# Patient Record
Sex: Female | Born: 1970 | Race: White | Hispanic: No | Marital: Married | Smoking: Never smoker
Health system: Southern US, Community
[De-identification: ages and names within clinical notes are randomized; demographics above are authoritative.]

## PROBLEM LIST (undated history)

## (undated) DIAGNOSIS — R519 Headache, unspecified: Secondary | ICD-10-CM

## (undated) DIAGNOSIS — R51 Headache: Secondary | ICD-10-CM

## (undated) DIAGNOSIS — E785 Hyperlipidemia, unspecified: Secondary | ICD-10-CM

## (undated) DIAGNOSIS — I10 Essential (primary) hypertension: Secondary | ICD-10-CM

## (undated) HISTORY — DX: Headache: R51

## (undated) HISTORY — DX: Headache, unspecified: R51.9

## (undated) HISTORY — DX: Hyperlipidemia, unspecified: E78.5

## (undated) HISTORY — DX: Essential (primary) hypertension: I10

---

## 2007-08-13 ENCOUNTER — Ambulatory Visit: Payer: Self-pay | Admitting: Emergency Medicine

## 2008-05-24 ENCOUNTER — Ambulatory Visit: Payer: Self-pay | Admitting: Internal Medicine

## 2010-06-12 ENCOUNTER — Ambulatory Visit: Payer: Self-pay | Admitting: Family Medicine

## 2012-08-06 LAB — CBC
HCT: 30.8 % — ABNORMAL LOW (ref 35.0–47.0)
HGB: 10.1 g/dL — ABNORMAL LOW (ref 12.0–16.0)
MCH: 29 pg (ref 26.0–34.0)
MCV: 89 fL (ref 80–100)
Platelet: 269 10*3/uL (ref 150–440)
RBC: 3.48 10*6/uL — ABNORMAL LOW (ref 3.80–5.20)
RDW: 13.1 % (ref 11.5–14.5)
WBC: 10.6 10*3/uL (ref 3.6–11.0)

## 2012-08-06 LAB — COMPREHENSIVE METABOLIC PANEL
Alkaline Phosphatase: 92 U/L (ref 50–136)
BUN: 17 mg/dL (ref 7–18)
Bilirubin,Total: 0.2 mg/dL (ref 0.2–1.0)
Calcium, Total: 9 mg/dL (ref 8.5–10.1)
Chloride: 104 mmol/L (ref 98–107)
Co2: 28 mmol/L (ref 21–32)
Creatinine: 1.02 mg/dL (ref 0.60–1.30)
EGFR (African American): >60
EGFR (Non-African Amer.): >60
Glucose: 97 mg/dL (ref 65–99)
Osmolality: 285 (ref 275–301)
Potassium: 3.6 mmol/L (ref 3.5–5.1)
SGPT (ALT): 25 U/L (ref 12–78)
Sodium: 142 mmol/L (ref 136–145)
Total Protein: 7.4 g/dL (ref 6.4–8.2)

## 2012-08-06 LAB — CK TOTAL AND CKMB (NOT AT ARMC): CK-MB: 3.1 ng/mL (ref 0.5–3.6)

## 2012-08-06 LAB — URINALYSIS, COMPLETE
Bilirubin,UR: NEGATIVE
Glucose,UR: NEGATIVE mg/dL (ref 0–75)
Nitrite: NEGATIVE
RBC,UR: 2 /HPF (ref 0–5)
Squamous Epithelial: 15
WBC UR: 6 /HPF (ref 0–5)

## 2012-08-06 LAB — TROPONIN I: Troponin-I: <0.02 ng/mL

## 2012-08-06 LAB — PHOSPHORUS: Phosphorus: 2.8 mg/dL (ref 2.5–4.9)

## 2012-08-06 LAB — MAGNESIUM: Magnesium: 1.8 mg/dL

## 2012-08-07 ENCOUNTER — Observation Stay: Payer: Self-pay | Admitting: Specialist

## 2012-08-07 LAB — TROPONIN I: Troponin-I: <0.02 ng/mL

## 2013-08-18 IMAGING — CR DG CHEST 1V PORT
1 series · 1 of 1 positions shown · non-contrast
Comparison: none

REASON FOR EXAM: syncope
COMMENTS:

[ap]
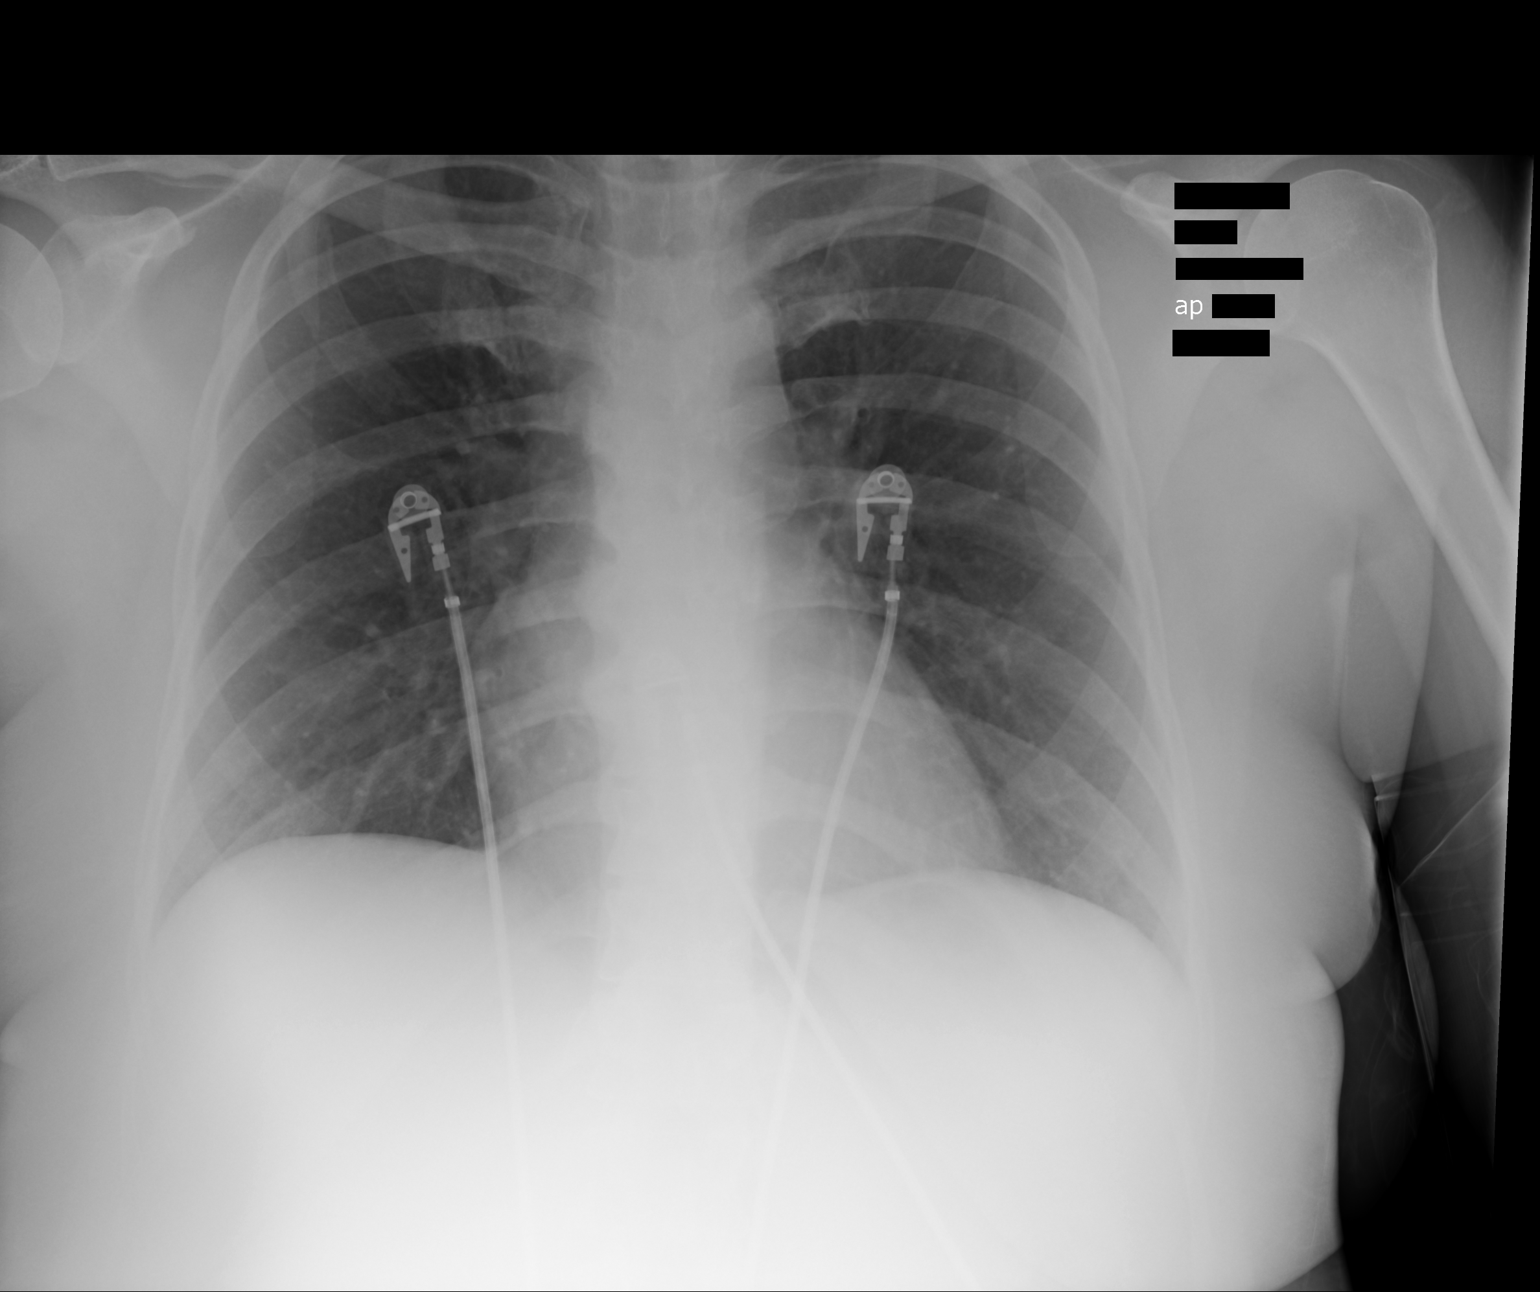

[1 of 1 positions shown; findings below may reference images not displayed]

PROCEDURE:     DXR - DXR PORTABLE CHEST SINGLE VIEW  - August 06, 2012  [DATE]

RESULT:     A cardiac monitoring electrodes are present. The lungs are
clear. The heart and pulmonary vessels are normal. The bony and mediastinal
structures are unremarkable. There is no effusion. There is no pneumothorax
or evidence of congestive failure.
IMPRESSION: No acute cardiopulmonary disease.

[REDACTED]

## 2015-02-07 NOTE — H&P (Signed)
PATIENT NAME:  Beth Jones, Beth Jones MR#:  [id number] DATE OF BIRTH:  Apr 13, 1971  DATE OF ADMISSION:  08/07/2012  PRIMARY CARE PHYSICIAN: [facility]  REFERRING PHYSICIAN: Dr. Theophilus Kinds  CHIEF COMPLAINT: Syncope.   HISTORY OF PRESENT ILLNESS: Ms. Fedorko is 44 year old pleasant Caucasian female with history of type 1 diabetes mellitus on insulin, systemic hypertension and hypercholesterolemia. She was in her usual state of health until today while she was at work. She lifted a heavy box and shortly she felt tingling or numbness sensation in both arms. She tried to relax but then when she tried to walk she developed dizziness and then suddenly she fell to the floor and having syncope, but without true loss of consciousness as patient she still can hear her friends talking. EMS was called and patient was transported to the Emergency Department for further evaluation. Patient also indicated that she felt ringing in the ear. Her blood pressure here was measured 78/43. Patient received intravenous fluids which helped to improve her blood pressure. Patient denies having any palpitations. No chest pain. No shortness of breath. No cough. No hemoptysis. No calf tenderness or swelling. It is worth to mention that her primary care physician increased her blood pressure medication a month ago and she said that the dose was 10 mg and it was increased to 20 mg. She does not recall the name of the medication but it appears to be in combination with hydrochlorothiazide. Patient indicates that since that time on and off she may feel a little dizziness but not like today. Patient was admitted for observation and to also monitor on telemetry to ensure stability. Her EKG and blood work-up were unremarkable.    REVIEW OF SYSTEMS: CONSTITUTIONAL: Patient denies having any fever. No chills. No fatigue. EYES: No blurring of vision. No double vision. ENT: No hearing impairment other than her chronic decreased hearing but no  change lately. No sore throat. No dysphagia. CARDIOVASCULAR: No chest pain. No shortness of breath. No edema. RESPIRATORY: No shortness of breath. No cough. No chest pain. No hemoptysis. GASTROINTESTINAL: No abdominal pain. No vomiting. No diarrhea. GENITOURINARY: No dysuria. No frequency of urination. MUSCULOSKELETAL: No joint pain or swelling. No muscular pain or swelling. INTEGUMENTARY: No skin rash. No ulcers. NEUROLOGY: No focal weakness. No seizure activity. No headache. PSYCHIATRY: No anxiety. No depression. ENDOCRINE: No polyuria or polydipsia. No heat or cold intolerance.   PAST MEDICAL HISTORY:  1. Type 1 diabetes mellitus on insulin since age of 14.  2. Systemic hypertension.   3. Hypercholesterolemia.  4. Obesity.   FAMILY HISTORY: Her father suffered from diabetes mellitus and hypertension. Her mother suffered from hypertension. She has a brother who has diabetes mellitus.   SOCIAL HABITS: Nonsmoker. No history of alcohol or drug abuse.   SOCIAL HISTORY: She is married, living with her husband. She works at [employer]. Her job is to either work as a Paramedic.    ADMISSION MEDICATIONS:  1. Simvastatin 40 mg a day.  2. Lantus insulin using 89 units at night.  3. Humalog using according to her diet but average 25 to 30 units before each meal and also she uses sliding scale in case it is needed.  4. She is on a blood pressure medication that she does not recall the name, its dose was doubled a month ago.  5. She is on gabapentin 3 times a day but does not recall the dose.   ALLERGIES: No known drug allergies.  PHYSICAL EXAMINATION:  VITAL SIGNS: Blood pressure 113/56, respiratory rate 18, pulse 97, temperature 98.5, oxygen saturation 99%. This blood pressure had improved after IV fluids. The initial blood pressure was 78/43.   GENERAL APPEARANCE: Young female lying in bed in no acute distress.   HEAD AND NECK: No pallor. No icterus. No cyanosis.    ENT: Hearing was slightly impaired. Nasal mucosa, lips, tongue were normal.   EYES: Normal eyelids and conjunctiva. Pupils about 4 to 5 mm, equal and reactive to light.   NECK: Supple. Trachea at midline. No thyromegaly. No cervical lymphadenopathy. No masses.   HEART: Normal S1, S2. No S3, S4. No murmur. No gallop. No carotid bruits.   RESPIRATORY: Normal breathing pattern without use of accessory muscles. No rales. No wheezing.   ABDOMEN: Soft without tenderness. No hepatosplenomegaly. No masses. No hernias.   SKIN: No ulcers. No subcutaneous nodules.   MUSCULOSKELETAL: No joint swelling. No clubbing.   NEUROLOGIC: Cranial nerves II through XII are intact. No focal motor deficit.   PSYCHIATRIC: Patient is alert, oriented x3. Mood and affect were normal.   LABORATORY, DIAGNOSTIC, AND RADIOLOGICAL DATA: EKG showed normal sinus rhythm or sinus bradycardia at rate of 59 per minute. Unremarkable EKG. Chest x-ray showed normal heart size. No consolidation, no effusion. Serum glucose 97, BUN 17, creatinine 1.02, sodium 142, potassium 3.6. Urine pregnancy test was negative. Calcium 9, phosphorus 2.8, magnesium was normal at 1.8. Normal liver function tests. Total CPK 95, troponin less than 0.02. TSH 1.8. CBC showed white count 10,000, hemoglobin 10, hematocrit 30, platelet count 269. Normal indices MCV, MCH, MCHC were all normal. Urinalysis showed cloudy urine, however, there are 15 epithelial cells and 6 white blood cells.   ASSESSMENT:  1. Syncope in association with hypotension likely potentiated by recent increase of her blood pressure medication.  2. Diabetes mellitus, type 1.  3. Peripheral neuropathy.  4. Hypercholesterolemia.  5. Systemic hypertension.   PLAN: Patient received so far 2 liters of intravenous fluids. I will maintain IV fluid at a rate of 100 mL/h. I will hold her blood pressure medication. However, upon discharge patient needs to reduce her blood pressure medication  to the original dose a month ago and follow up with her primary care physician. I will follow up on her troponin q.8 hours x2. Will monitor over telemetry. Patient right now is completely asymptomatic after the IV fluids and improvement of her blood pressure. I will continue her simvastatin. Will place her on Accu-Chek and resume her Lantus insulin and Humalog. Patient stated that she wants to go home now but then she compromised to stay overnight.   TIME SPENT EVALUATING THIS PATIENT: More than 55 minutes.   ____________________________ Carney CornersAmir M. Rudene Rearwish, MD amd:cms D: 08/07/2012 00:29:26 ET T: 08/07/2012 09:15:45 ET JOB#: [id number]  cc: Carney CornersAmir M. Rudene Rearwish, MD, <Dictator> [facility] Zollie ScaleAMIR M Daine Gunther MD ELECTRONICALLY SIGNED 08/07/2012 22:31

## 2015-02-07 NOTE — Discharge Summary (Signed)
PATIENT NAME:  Beth Jones, Beth Jones MR#:  [id number] DATE OF BIRTH:  1971/09/11  DATE OF ADMISSION:  08/07/2012 DATE OF DISCHARGE:  08/07/2012  For a detailed note, please take a look at the history and physical done on admission by Dr. Rudene Rearwish.   DIAGNOSES AT DISCHARGE:  1. Presyncope likely secondary to hypotension.  2. Hypotension secondary to being on high dose antihypertensives.  3. Diabetes.  4. Diabetic neuropathy.   DIET: The patient is being discharged on a low sodium, low fat, American Diabetic Association diet.   ACTIVITY: As tolerated.   FOLLOWUP: Follow-up with Dr. Gershon Crane'Connell in the next 1 to 2 weeks.   DISCHARGE MEDICATIONS:  1. Gabapentin 600 mg t.i.d.  2. Regular insulin sliding scale t.i.d. as needed.  3. Lantus 80 units at bedtime.  4. Simvastatin 40 mg at bedtime.  5. HCTZ/lisinopril 12.5/20 half a tab daily.   PERTINENT STUDIES DONE DURING THE HOSPITAL COURSE: A chest x-ray done on admission showing no acute cardiopulmonary disease.   BRIEF HOSPITAL COURSE: This is a 44 year old female with medical problems as mentioned above who presented to the hospital secondary to a presyncopal episode and noted to be hypotensive.  1. Presyncope. The patient apparently had an episode where she fell to the floor but did not lose consciousness. EMS was called. She was noted to be significantly hypotensive with systolic blood pressures in the 80's. The patient's antihypertensives have actually been doubled in the past month and, as per the family, she has also been running low blood pressures at home. Her antihypertensives were held. She was aggressively hydrated with IV fluids. Her hypotension and her clinical symptoms have since then resolved.  2. Hypertension with element of hypotension. Again, the patient was on lisinopril/HCTZ which was recently increased. The dose of her lisinopril/HCTZ has now been cut in half and she has been advised to keep a close eye on her blood pressure and  to hold hypertensives if her systolic blood pressure is less than 90. She will have close follow-up with her primary care physician as outpatient.  3. Diabetes. The patient had no evidence of hypoglycemia. She will continue her Lantus and regular insulin sliding scale as mentioned.  4. Diabetic neuropathy. The patient was maintained on Neurontin and she will resume that upon discharge too.   CODE STATUS: The patient is a FULL CODE.   TIME SPENT: 35 minutes.   ____________________________ Rolly PancakeVivek J. Cherlynn KaiserSainani, MD vjs:drc D: 08/07/2012 15:55:07 ET T: 08/09/2012 14:58:55 ET JOB#: [id number]  cc: Rolly PancakeVivek J. Cherlynn KaiserSainani, MD, <Dictator> Dr. Tanya Nones'Connell  Shamaine Mulkern J Javani Spratt MD ELECTRONICALLY SIGNED 08/09/2012 21:57

## 2018-11-16 ENCOUNTER — Encounter: Payer: BLUE CROSS/BLUE SHIELD | Attending: Family Medicine | Admitting: Dietician

## 2018-11-16 ENCOUNTER — Encounter: Payer: Self-pay | Admitting: Dietician

## 2018-11-16 VITALS — BP 154/76 | Ht 65.0 in | Wt 186.8 lb

## 2018-11-16 DIAGNOSIS — Z6831 Body mass index (BMI) 31.0-31.9, adult: Secondary | ICD-10-CM | POA: Diagnosis not present

## 2018-11-16 DIAGNOSIS — Z794 Long term (current) use of insulin: Secondary | ICD-10-CM | POA: Diagnosis not present

## 2018-11-16 DIAGNOSIS — Z79899 Other long term (current) drug therapy: Secondary | ICD-10-CM | POA: Diagnosis not present

## 2018-11-16 DIAGNOSIS — E669 Obesity, unspecified: Secondary | ICD-10-CM | POA: Insufficient documentation

## 2018-11-16 DIAGNOSIS — Z713 Dietary counseling and surveillance: Secondary | ICD-10-CM | POA: Diagnosis not present

## 2018-11-16 DIAGNOSIS — E10649 Type 1 diabetes mellitus with hypoglycemia without coma: Secondary | ICD-10-CM | POA: Diagnosis not present

## 2018-11-16 DIAGNOSIS — E1040 Type 1 diabetes mellitus with diabetic neuropathy, unspecified: Secondary | ICD-10-CM

## 2018-11-16 NOTE — Patient Instructions (Signed)
  Check blood sugars 4 x day before each meal and before bed every day + some 2 hr after meals  Bring blood sugar records to the next appointment/class   Eat 3 meals day and  1 snack/day at bedtime   Space meals 4-5 hours apart  Eat 2-3 carbohydrate servings/meal + protein  Eat 1 carbohydrate serving/snack + protein  Avoid sugar sweetened drinks (soda, tea, coffee, sports drinks, juices)  Drinks plenty of water   Limit intake of sweets, fried foods and snack foods  Complete 3 Day Food Record and bring to next appt  Make  dentist / eye doctor appointments  Get a Sharps container  Carry fast acting glucose and a snack at all times  Rotate injection sites  Carry medical alert ID at all times  Return for appointment/classes on:  12-01-18

## 2018-11-16 NOTE — Progress Notes (Signed)
Diabetes Self-Management Education  Visit Type: First/Initial  Appt. Start Time: 0900 Appt. End Time:1030  11/16/2018  [name], identified by name and date of birth, is a 48 y.o. female with a diagnosis of Diabetes: Type 1.   ASSESSMENT  Blood pressure (!) 154/76, height 5\' 5"  (1.651 m), weight 186 lb 12.8 oz (84.7 kg). Body mass index is 31.09 kg/m.  Diabetes Self-Management Education - 11/16/18 1303      Visit Information   Visit Type  First/Initial      Initial Visit   Diabetes Type  Type 1      Health Coping   How would you rate your overall health?  Fair      Psychosocial Assessment   Patient Belief/Attitude about Diabetes  Motivated to manage diabetes    Self-care barriers  Hard of hearing   wears 2 hearing aides   Self-management support  Doctor's office;Family    Other persons present  Patient    Patient Concerns  Glycemic Control;Healthy Lifestyle;Weight Control   become more fit   Special Needs  None    Preferred Learning Style  Hands on;Auditory    What is the last grade level you completed in school?  GED      Pre-Education Assessment   Patient understands the diabetes disease and treatment process.  Demonstrates understanding / competency    Patient understands incorporating nutritional management into lifestyle.  Needs Review    Patient undertands incorporating physical activity into lifestyle.  Needs Review    Patient understands using medications safely.  Needs Review    Patient understands monitoring blood glucose, interpreting and using results  Needs Review    Patient understands prevention, detection, and treatment of acute complications.  Needs Review    Patient understands prevention, detection, and treatment of chronic complications.  Needs Review    Patient understands how to develop strategies to address psychosocial issues.  Needs Review    Patient understands how to develop strategies to promote health/change behavior.  Needs  Review      Complications   Last HgB A1C per patient/outside source  8.2 %   10-22-18   How often do you check your blood sugar?  3-4 times/day    Fasting Blood glucose range (mg/dL)  >381;<01;75-102;585-277;824-235  TODAY'S FBG 280 and TOOK NOVOLOG 15 units FBG's and ac meals- Results vary 40's-400's; reports having low BG's often and some at night   Postprandial Blood glucose range (mg/dL)  >361'-443'X      Have you had a dilated eye exam in the past 12 months?  No   2018   Have you had a dental exam in the past 12 months?  No   about 5 years ago   Are you checking your feet?  Yes   reports neuropathy in feet-c/o numbness, tingling and pain feet   How many days per week are you checking your feet?  2      Dietary Intake   Breakfast  eats breakfast at 8a=2 eggs and sausage; eats out 3x/wk    Snack (morning)  occasional if low BG-eats snack foods daily    Lunch  time varies due to work=eats between 1p-4p    Snack (afternoon)  none    Dinner  supper time varies-eats out 1x/wk    Snack (evening)  eats chips, crackers or fruit at 9p    Beverage(s)  drinks water 6-7x/day, unsweetened beverages 2x/day, occasional milk      Exercise   Exercise  Type  ADL's      Patient Education   Previous Diabetes Education  Yes (please comment)   at [facility]   Nutrition management   Food label reading, portion sizes and measuring food.;Carbohydrate counting;Role of diet in the treatment of diabetes and the relationship between the three main macronutrients and blood glucose level    Physical activity and exercise   Role of exercise on diabetes management, blood pressure control and cardiac health.    Medications  Taught/reviewed insulin injection, site rotation, insulin storage and needle disposal.;Reviewed patients medication for diabetes, action, purpose, timing of dose and side effects; pt currently guesses about amount of Novolog to take for meals and correction-sometimes takes no Novolog; discussed  more intense regimen using  Novolog and Basaglar pens using ICR and sliding scale but pt unable to do ICR calculations-she may do better taking a set amount of Novolog for size of meal; answered pt's questions about insulin pump therapy and showed her Medtronic, Tandem and Omnipod pumps and discussed how pump therapy works with it's advantages and disadvantages-pt is not ready for a pump at this time   Monitoring  Purpose and frequency of SMBG.;Taught/discussed recording of test results and interpretation of SMBG.;Identified appropriate SMBG and/or A1C goals.;Yearly dilated eye exam    Acute complications  Taught treatment of hypoglycemia - the 15 rule.;Discussed and identified patients' treatment of hyperglycemia.    Chronic complications  Relationship between chronic complications and blood glucose control;Identified and discussed with patient  current chronic complications;Retinopathy and reason for yearly dilated eye exams;Dental care;Lipid levels, blood glucose control and heart disease;Reviewed with patient heart disease, higher risk of, and prevention;Nephropathy, what it is, prevention of, the use of ACE, ARB's and early detection of through urine microalbumia.    Personal strategies to promote health  Lifestyle issues that need to be addressed for better diabetes care;Helped patient develop diabetes management plan for (enter comment)      Outcomes   Expected Outcomes  Demonstrated interest in learning. Expect positive outcomes       Individualized Plan for Diabetes Self-Management Training:   Learning Objective:  Patient will have a greater understanding of diabetes self-management. Patient education plan is to attend individual and/or group sessions per assessed needs and concerns.   Plan:   Patient Instructions   Check blood sugars 4 x day before each meal and before bed every day + some 2 hr after meals  Bring blood sugar records to the next appointment/class   Eat 3 meals day  and  1 snack/day at bedtime   Space meals 4-5 hours apart  Eat 2-3 carbohydrate servings/meal + protein  Eat 1 carbohydrate serving/snack + protein  Avoid sugar sweetened drinks (soda, tea, coffee, sports drinks, juices)  Drinks plenty of water   Limit intake of sweets, fried foods and snack foods  Complete 3 Day Food Record and bring to next appt  Make  dentist / eye doctor appointments  Get a Sharps container  Carry fast acting glucose and a snack at all times  Rotate injection sites  Take Novolog 15 min. before meals when able  Carry medical alert ID at all times  Return for appointment/classes on:  12-01-18   Expected Outcomes:  Demonstrated interest in learning. Expect positive outcomes  Education material provided: General meal planning guidelines, Food Group handout, Low BG handout  If problems or questions, patient to contact team via:  [phone]  Future DSME appointment:  12-01-18

## 2018-11-23 ENCOUNTER — Encounter: Payer: Self-pay | Admitting: Dietician

## 2018-11-23 NOTE — Progress Notes (Signed)
Called pt for FU-no answer-left message for pt to call me with update on BG's

## 2018-12-01 ENCOUNTER — Encounter: Payer: Self-pay | Admitting: Dietician

## 2018-12-01 ENCOUNTER — Encounter: Payer: BLUE CROSS/BLUE SHIELD | Attending: Family Medicine | Admitting: Dietician

## 2018-12-01 VITALS — Ht 65.0 in | Wt 189.6 lb

## 2018-12-01 DIAGNOSIS — Z79899 Other long term (current) drug therapy: Secondary | ICD-10-CM | POA: Diagnosis not present

## 2018-12-01 DIAGNOSIS — Z713 Dietary counseling and surveillance: Secondary | ICD-10-CM | POA: Insufficient documentation

## 2018-12-01 DIAGNOSIS — E10649 Type 1 diabetes mellitus with hypoglycemia without coma: Secondary | ICD-10-CM | POA: Insufficient documentation

## 2018-12-01 DIAGNOSIS — E669 Obesity, unspecified: Secondary | ICD-10-CM | POA: Insufficient documentation

## 2018-12-01 DIAGNOSIS — Z794 Long term (current) use of insulin: Secondary | ICD-10-CM | POA: Diagnosis not present

## 2018-12-01 DIAGNOSIS — Z6831 Body mass index (BMI) 31.0-31.9, adult: Secondary | ICD-10-CM | POA: Diagnosis not present

## 2018-12-01 DIAGNOSIS — E1040 Type 1 diabetes mellitus with diabetic neuropathy, unspecified: Secondary | ICD-10-CM

## 2018-12-01 NOTE — Progress Notes (Signed)
Diabetes Self-Management Education  Visit Type:  Follow-up  Appt. Start Time: 0900 Appt. End Time: 1020  12/01/2018  [name], identified by name and date of birth, is a 48 y.o. female with a diagnosis of Diabetes:  .   ASSESSMENT  Height 5\' 5"  (1.651 m), weight 189 lb 9.6 oz (86 kg). Body mass index is 31.55 kg/m.   Diabetes Self-Management Education - 12/01/18 0913      Complications   How often do you check your blood sugar?  3-4 times/day    Fasting Blood glucose range (mg/dL)  614-431;54-008;<67;619-509;>326    Postprandial Blood glucose range (mg/dL)  712-458;099-833;>825    Have you had a dilated eye exam in the past 12 months?  No    Have you had a dental exam in the past 12 months?  No    Are you checking your feet?  Yes      Dietary Intake   Breakfast  3 meals and 0-1 snack daily      Exercise   Exercise Type  ADL's   active job [employer])     Patient Education   Nutrition management   Role of diet in the treatment of diabetes and the relationship between the three main macronutrients and blood glucose level;Carbohydrate counting;Meal timing in regards to the patients' current diabetes medication.;Meal options for control of blood glucose level and chronic complications.    Physical activity and exercise   Role of exercise on diabetes management, blood pressure control and cardiac health.    Acute complications  Other (comment)   prevention of hypoglycemia   Psychosocial adjustment  Role of stress on diabetes       Learning Objective:  Patient will have a greater understanding of diabetes self-management. Patient education plan is to attend individual and/or group sessions per assessed needs and concerns.  Worked with patient on carbohydrate counting and aiming for consistent carb intake. Advised eating every 4-5 hours during the day, and including protein source with every meal. Patient is now keeping snacks and glucose tabs on hand at home and  at work to treat hypoglycemia. Encouraged her to continue practicing carb counting.    Plan:   Patient Instructions   Make sure to eat every 4-5 hours during the day and include protein along with carbs at each meal to help prevent low blood sugars.   Aim for 30-45 gram carbohydrate with each meal, practice counting carbs.   Consider keeping an ongoing food diary to practice counting carbs, or use an app or website like calorieking.com, MyFtinessPal, or another free plan.     Expected Outcomes:     Education material provided: Carb Counting and Meal Planning [employer])  If problems or questions, patient to contact team via:  Phone and Email  Future DSME appointment: -  patient will call back to schedule RN visit.

## 2018-12-01 NOTE — Patient Instructions (Signed)
   Make sure to eat every 4-5 hours during the day and include protein along with carbs at each meal to help prevent low blood sugars.   Aim for 30-45 gram carbohydrate with each meal, practice counting carbs.   Consider keeping an ongoing food diary to practice counting carbs, or use an app or website like calorieking.com, MyFtinessPal, or another free plan.

## 2018-12-07 ENCOUNTER — Encounter: Payer: Self-pay | Admitting: Dietician

## 2018-12-07 NOTE — Progress Notes (Signed)
Called pt-no answer-left message for pt to call back and schedule a FU appointment with me

## 2018-12-15 ENCOUNTER — Encounter: Payer: Self-pay | Admitting: Dietician

## 2018-12-15 NOTE — Progress Notes (Signed)
Have not heard from pt-called pt-no answer-left message for pt to call and schedule an appointment with me

## 2019-01-04 ENCOUNTER — Encounter: Payer: Self-pay | Admitting: Dietician

## 2019-01-04 NOTE — Progress Notes (Signed)
No response from messages left for pt to call and reschedule her missed appointment

## 2019-02-17 ENCOUNTER — Encounter: Payer: Self-pay | Admitting: Dietician
